# Patient Record
Sex: Male | Born: 1980 | Race: White | Hispanic: No | Marital: Married | Smoking: Never smoker
Health system: Southern US, Community
[De-identification: ages and names within clinical notes are randomized; demographics above are authoritative.]

---

## 2016-08-26 DIAGNOSIS — G43009 Migraine without aura, not intractable, without status migrainosus: Secondary | ICD-10-CM | POA: Insufficient documentation

## 2016-08-26 DIAGNOSIS — F411 Generalized anxiety disorder: Secondary | ICD-10-CM | POA: Insufficient documentation

## 2017-02-03 DIAGNOSIS — R41840 Attention and concentration deficit: Secondary | ICD-10-CM | POA: Insufficient documentation

## 2017-09-25 DIAGNOSIS — I10 Essential (primary) hypertension: Secondary | ICD-10-CM | POA: Insufficient documentation

## 2017-10-30 DIAGNOSIS — N529 Male erectile dysfunction, unspecified: Secondary | ICD-10-CM | POA: Insufficient documentation

## 2019-06-22 DIAGNOSIS — E781 Pure hyperglyceridemia: Secondary | ICD-10-CM | POA: Insufficient documentation

## 2020-01-04 ENCOUNTER — Ambulatory Visit: Payer: 59 | Attending: Internal Medicine

## 2020-01-04 DIAGNOSIS — Z23 Encounter for immunization: Secondary | ICD-10-CM

## 2020-01-04 NOTE — Progress Notes (Signed)
   Covid-19 Vaccination Clinic  Name:  Jacob Benton    MRN: [id number] DOB: 09/10/1980  01/04/2020  Mr. Jacob Benton was observed post Covid-19 immunization for 15 minutes without incident. He was provided with Vaccine Information Sheet and instruction to access the V-Safe system.   Mr. Jacob Benton was instructed to call 911 with any severe reactions post vaccine: Marland Kitchen Difficulty breathing  . Swelling of face and throat  . A fast heartbeat  . A bad rash all over body  . Dizziness and weakness   Immunizations Administered    Name Date Dose VIS Date Route   Pfizer COVID-19 Vaccine 01/04/2020  9:29 AM 0.3 mL 10/17/2018 Intramuscular   Manufacturer: [employer], [employer]   Lot: [id number]   NDC: [address]-3

## 2020-02-04 DIAGNOSIS — K580 Irritable bowel syndrome with diarrhea: Secondary | ICD-10-CM | POA: Insufficient documentation

## 2020-02-05 ENCOUNTER — Ambulatory Visit: Payer: 59 | Attending: Internal Medicine

## 2020-02-05 DIAGNOSIS — Z23 Encounter for immunization: Secondary | ICD-10-CM

## 2020-02-05 NOTE — Progress Notes (Signed)
   Covid-19 Vaccination Clinic  Name:  Jacob Benton    MRN: [id number] DOB: 30-Jul-1981  02/05/2020  Mr. Jacob Benton was observed post Covid-19 immunization for 15 minutes without incident. He was provided with Vaccine Information Sheet and instruction to access the V-Safe system.   Mr. Jacob Benton was instructed to call 911 with any severe reactions post vaccine: Marland Kitchen Difficulty breathing  . Swelling of face and throat  . A fast heartbeat  . A bad rash all over body  . Dizziness and weakness   Immunizations Administered    Name Date Dose VIS Date Route   Pfizer COVID-19 Vaccine 02/05/2020  8:41 AM 0.3 mL 10/17/2018 Intramuscular   Manufacturer: [employer], [employer]   Lot: [id number]   NDC: [address]-8

## 2020-05-06 ENCOUNTER — Other Ambulatory Visit: Payer: Self-pay

## 2020-05-06 ENCOUNTER — Ambulatory Visit (INDEPENDENT_AMBULATORY_CARE_PROVIDER_SITE_OTHER): Payer: 59 | Admitting: Dermatology

## 2020-05-06 DIAGNOSIS — L300 Nummular dermatitis: Secondary | ICD-10-CM | POA: Diagnosis not present

## 2020-05-06 DIAGNOSIS — L720 Epidermal cyst: Secondary | ICD-10-CM

## 2020-05-06 MED ORDER — CLOBETASOL PROPIONATE 0.05 % EX CREA: TOPICAL_CREAM | CUTANEOUS | 0 refills | Status: DC

## 2020-05-06 NOTE — Patient Instructions (Signed)
Topical steroids (such as triamcinolone, fluocinolone, fluocinonide, mometasone, clobetasol, halobetasol, betamethasone, hydrocortisone) can cause thinning and lightening of the skin if they are used for too long in the same area. Your physician has selected the right strength medicine for your problem and area affected on the body. Please use your medication only as directed by your physician to prevent side effects.   . 

## 2020-05-06 NOTE — Progress Notes (Signed)
   New Patient Visit  Subjective  Jacob Benton is a 39 y.o. male who presents for the following: Spot (left lower leg x 2 months, itchy) and spot (right face, present for years). No changes. No history of psoriasis, eczema, allergies, or asthma.  The following portions of the chart were reviewed this encounter and updated as appropriate:      Review of Systems:  No other skin or systemic complaints except as noted in HPI or Assessment and Plan.  Objective  Well appearing patient in no apparent distress; mood and affect are within normal limits.  A focused examination was performed including face, left leg. Relevant physical exam findings are noted in the Assessment and Plan.  Objective  Left Lateral Lower Leg: Small pink scaly plaque.  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 Avoid face, groin, axilla.  Avoid scratching/rubbing.  Recommend mild soap and moisturizing cream 1-2 times daily.   Topical steroids (such as triamcinolone, fluocinolone, fluocinonide, mometasone, clobetasol, halobetasol, betamethasone, hydrocortisone) can cause thinning and lightening of the skin if they are used for too long in the same area. Your physician has selected the right strength medicine for your problem and area affected on the body. Please use your medication only as directed by your physician to prevent side effects.    clobetasol cream (TEMOVATE) 0.05 % - Left Lateral Lower Leg  Epidermal inclusion cyst Right Medial Lower Cheek  vs Dermal Nevus with Cyst  Benign-appearing.  Observation.  Call clinic for new or changing moles.  Recommend daily use of broad spectrum spf 30+ sunscreen to sun-exposed areas.   Discussed removal if changes or grows.  Return in about 4 weeks (around  06/03/2020) for [facility] .   ICherlyn Labella, CMA, am acting as scribe for Willeen Niece, MD .  Documentation: I have reviewed the above documentation for accuracy and completeness, and I agree with the above.  Willeen Niece MD

## 2020-06-10 ENCOUNTER — Ambulatory Visit (INDEPENDENT_AMBULATORY_CARE_PROVIDER_SITE_OTHER): Payer: 59 | Admitting: Dermatology

## 2020-06-10 ENCOUNTER — Other Ambulatory Visit: Payer: Self-pay

## 2020-06-10 DIAGNOSIS — L299 Pruritus, unspecified: Secondary | ICD-10-CM | POA: Diagnosis not present

## 2020-06-10 DIAGNOSIS — L280 Lichen simplex chronicus: Secondary | ICD-10-CM | POA: Diagnosis not present

## 2020-06-10 NOTE — Progress Notes (Signed)
   Follow-Up Visit   Subjective  Jacob Benton is a 39 y.o. male who presents for the following: Follow-up.  Patient here today for 4 week follow up for nummular dermatitis vs psoriasis at left lower leg. He has been using clobetasol cream and advises the scale has improved and is still a little itchy.   The following portions of the chart were reviewed this encounter and updated as appropriate:      Review of Systems:  No other skin or systemic complaints except as noted in HPI or Assessment and Plan.  Objective  Well appearing patient in no apparent distress; mood and affect are within normal limits.  A focused examination was performed including left lower leg. Relevant physical exam findings are noted in the Assessment and Plan.  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  Avoid scratching/rubbing area  Intralesional injection - Left Lower Leg Location: left lower leg  Informed Consent: Discussed risks (infection, pain, bleeding, bruising, thinning of the skin, loss of skin pigment, lack of resolution, and recurrence of lesion) and benefits of the procedure, as well as the alternatives. Informed consent was obtained. Preparation: The area was prepared a standard fashion.  Anesthesia:none  Procedure Details: An intralesional injection was performed with Kenalog 10 mg/cc. 0.2 cc in total were injected.  Total number of injections: > 7  Plan: The patient was instructed on post-op care. Recommend OTC analgesia as needed for pain.   Pruritus  Return in about 4 weeks (around 07/08/2020) for [facility].  Anise Salvo, RMA, am acting as scribe for Willeen Niece, MD . Documentation: I have reviewed the above documentation for accuracy and completeness, and I agree with the  above.  Willeen Niece MD

## 2020-07-14 ENCOUNTER — Ambulatory Visit: Payer: 59 | Admitting: Dermatology

## 2020-09-25 ENCOUNTER — Other Ambulatory Visit: Payer: Self-pay | Admitting: Gastroenterology

## 2020-09-25 DIAGNOSIS — R7989 Other specified abnormal findings of blood chemistry: Secondary | ICD-10-CM

## 2020-10-01 ENCOUNTER — Other Ambulatory Visit: Payer: Self-pay

## 2020-10-01 ENCOUNTER — Ambulatory Visit
Admission: RE | Admit: 2020-10-01 | Discharge: 2020-10-01 | Disposition: A | Discharge status: Home / Self Care | Payer: 59 | Source: Ambulatory Visit | Attending: Gastroenterology | Admitting: Gastroenterology

## 2020-10-01 DIAGNOSIS — R7989 Other specified abnormal findings of blood chemistry: Secondary | ICD-10-CM

## 2021-06-08 ENCOUNTER — Ambulatory Visit: Payer: 59 | Admitting: Dermatology

## 2021-07-02 ENCOUNTER — Other Ambulatory Visit: Payer: Self-pay

## 2021-07-02 ENCOUNTER — Ambulatory Visit
Admission: EM | Admit: 2021-07-02 | Discharge: 2021-07-02 | Disposition: A | Discharge status: Home / Self Care | Payer: BC Managed Care – PPO | Attending: Emergency Medicine | Admitting: Emergency Medicine

## 2021-07-02 ENCOUNTER — Encounter: Payer: Self-pay | Admitting: Emergency Medicine

## 2021-07-02 DIAGNOSIS — B349 Viral infection, unspecified: Secondary | ICD-10-CM | POA: Diagnosis not present

## 2021-07-02 LAB — POCT RAPID STREP A (OFFICE): Rapid Strep A Screen: NEGATIVE

## 2021-07-02 LAB — POCT INFLUENZA A/B
Influenza A, POC: NEGATIVE
Influenza B, POC: NEGATIVE

## 2021-07-02 MED ORDER — BENZONATATE 100 MG PO CAPS: 100.0000 mg | ORAL_CAPSULE | Freq: Three times a day (TID) | ORAL | 0 refills | Status: DC | PRN

## 2021-07-02 NOTE — Discharge Instructions (Addendum)
The rapid flu test is negative.  The rapid strep test is negative.    Your COVID test is pending.  You should self quarantine until the test result is back.    Take Tylenol or ibuprofen as needed for fever or discomfort.  Rest and keep yourself hydrated.    Follow-up with your primary care provider if your symptoms are not improving.

## 2021-07-02 NOTE — ED Triage Notes (Signed)
Pt here with 2 days of dizziness, fever, headache, body aches and congestion. Did not have flu vaccine this year.

## 2021-07-02 NOTE — ED Provider Notes (Signed)
[facility]    CSN: [id number] Arrival date & time: 07/02/21  1631      History   Chief Complaint Chief Complaint  Patient presents with   Dizziness   Fever   Generalized Body Aches   Nasal Congestion     HPI Jacob Benton is a 40 y.o. male.  Patient presents with 2-day history of fever, chills, body aches, headache, dizziness, congestion, sore throat, cough.  He denies rash, shortness of breath, vomiting, diarrhea, or other symptoms.  Treatment attempted at home with OTC cold and flu medication.  Patient reports history of recurrent strep throat and would like to be tested.  The history is provided by the patient.   History reviewed. No pertinent past medical history.  There are no problems to display for this patient.   History reviewed. No pertinent surgical history.     Home Medications    Prior to Admission medications   Medication Sig Start Date End Date Taking? Authorizing Provider  benzonatate (TESSALON) 100 MG capsule Take 1 capsule (100 mg total) by mouth 3 (three) times daily as needed for cough. 07/02/21  Yes Mickie Bail, NP  atenolol-chlorthalidone (TENORETIC) 50-25 MG tablet Take 1 tablet by mouth daily. 02/04/20 02/03/21  [provider]  clobetasol cream (TEMOVATE) 0.05 % Spot treat to affected area on left leg twice daily until improved. Avoid face, groin, underarms. 05/06/20   Willeen Niece, MD  omeprazole (PRILOSEC) 40 MG capsule Take by mouth. 02/04/20 02/03/21  [provider]  sertraline (ZOLOFT) 50 MG tablet Take 50 mg by mouth daily. 03/31/20   [provider]    Family History History reviewed. No pertinent family history.  Social History Social History   Tobacco Use   Smoking status: Never   Smokeless tobacco: Never  Substance Use Topics   Alcohol use: Not Currently   Drug use: Never     Allergies   Patient has no known allergies.   Review of Systems Review of Systems  Constitutional:  Positive  for chills and fever.  HENT:  Positive for congestion and sore throat. Negative for ear pain.   Respiratory:  Positive for cough. Negative for shortness of breath.   Cardiovascular:  Negative for chest pain and palpitations.  Gastrointestinal:  Negative for diarrhea and vomiting.  Skin:  Negative for color change and rash.  Neurological:  Positive for dizziness and headaches. Negative for syncope.  All other systems reviewed and are negative.   Physical Exam Triage Vital Signs ED Triage Vitals  Enc Vitals Group     BP      Pulse      Resp      Temp      Temp src      SpO2      Weight      Height      Head Circumference      Peak Flow      Pain Score      Pain Loc      Pain Edu?      Excl. in GC?    No data found.  Updated Vital Signs BP 139/84   Pulse 92   Temp 99.3 F (37.4 C)   Resp 18   SpO2 98%   Visual Acuity Right Eye Distance:   Left Eye Distance:   Bilateral Distance:    Right Eye Near:   Left Eye Near:    Bilateral Near:     Physical Exam Vitals and  nursing note reviewed.  Constitutional:      General: He is not in acute distress.    Appearance: He is well-developed.  HENT:     Head: Normocephalic and atraumatic.     Right Ear: Tympanic membrane normal.     Left Ear: Tympanic membrane normal.     Nose: Congestion present.     Mouth/Throat:     Mouth: Mucous membranes are moist.     Pharynx: Oropharynx is clear.  Eyes:     Conjunctiva/sclera: Conjunctivae normal.  Cardiovascular:     Rate and Rhythm: Normal rate and regular rhythm.     Heart sounds: Normal heart sounds.  Pulmonary:     Effort: Pulmonary effort is normal. No respiratory distress.     Breath sounds: Normal breath sounds.  Abdominal:     Palpations: Abdomen is soft.     Tenderness: There is no abdominal tenderness.  Musculoskeletal:     Cervical back: Neck supple.  Skin:    General: Skin is warm and dry.  Neurological:     Mental Status: He is alert.  Psychiatric:         Mood and Affect: Mood normal.        Behavior: Behavior normal.     UC Treatments / Results  Labs (all labs ordered are listed, but only abnormal results are displayed) Labs Reviewed  NOVEL CORONAVIRUS, NAA  POCT INFLUENZA A/B  POCT RAPID STREP A (OFFICE)    EKG   Radiology No results found.  Procedures Procedures (including critical care time)  Medications Ordered in UC Medications - No data to display  Initial Impression / Assessment and Plan / UC Course  I have reviewed the triage vital signs and the nursing notes.  Pertinent labs & imaging results that were available during my care of the patient were reviewed by me and considered in my medical decision making (see chart for details).   Viral illness.  Rapid flu negative. Rapid strep negative.  COVID pending.  Instructed patient to self quarantine per CDC guidelines.  Discussed symptomatic treatment including Tylenol or ibuprofen, rest, hydration.  Instructed patient to follow up with PCP if symptoms are not improving.  Patient agrees to plan of care.    Final Clinical Impressions(s) / UC Diagnoses   Final diagnoses:  Viral illness     Discharge Instructions      The rapid flu test is negative.  The rapid strep test is negative.    Your COVID test is pending.  You should self quarantine until the test result is back.    Take Tylenol or ibuprofen as needed for fever or discomfort.  Rest and keep yourself hydrated.    Follow-up with your primary care provider if your symptoms are not improving.         ED Prescriptions     Medication Sig Dispense Auth. Provider   benzonatate (TESSALON) 100 MG capsule Take 1 capsule (100 mg total) by mouth 3 (three) times daily as needed for cough. 21 capsule Mickie Bail, NP      PDMP not reviewed this encounter.   Mickie Bail, NP 07/02/21 [phone]

## 2021-07-03 LAB — NOVEL CORONAVIRUS, NAA: SARS-CoV-2, NAA: NOT DETECTED

## 2021-07-03 LAB — SARS-COV-2, NAA 2 DAY TAT

## 2021-10-20 ENCOUNTER — Encounter: Payer: Self-pay | Admitting: Podiatry

## 2021-10-20 ENCOUNTER — Ambulatory Visit (INDEPENDENT_AMBULATORY_CARE_PROVIDER_SITE_OTHER): Payer: BC Managed Care – PPO | Admitting: Podiatry

## 2021-10-20 ENCOUNTER — Other Ambulatory Visit: Payer: Self-pay

## 2021-10-20 ENCOUNTER — Ambulatory Visit (INDEPENDENT_AMBULATORY_CARE_PROVIDER_SITE_OTHER): Payer: BC Managed Care – PPO

## 2021-10-20 DIAGNOSIS — M109 Gout, unspecified: Secondary | ICD-10-CM

## 2021-10-20 DIAGNOSIS — M10071 Idiopathic gout, right ankle and foot: Secondary | ICD-10-CM | POA: Diagnosis not present

## 2021-10-20 MED ORDER — METHYLPREDNISOLONE 4 MG PO TBPK: ORAL_TABLET | ORAL | 0 refills | Status: AC

## 2021-10-20 MED ORDER — COLCHICINE 0.6 MG PO TABS: 0.6000 mg | ORAL_TABLET | Freq: Every day | ORAL | 1 refills | Status: DC

## 2021-10-20 NOTE — Patient Instructions (Signed)
Gout ?Gout is a condition that causes painful swelling of the joints. Gout is a type of inflammation of the joints (arthritis). This condition is caused by having too much uric acid in the body. Uric acid is a chemical that forms when the body breaks down substances called purines. Purines are important for building body proteins. ?When the body has too much uric acid, sharp crystals can form and build up inside the joints. This causes pain and swelling. Gout attacks can happen quickly and may be very painful (acute gout). Over time, the attacks can affect more joints and become more frequent (chronic gout). Gout can also cause uric acid to build up under the skin and inside the kidneys. ?What are the causes? ?This condition is caused by too much uric acid in your blood. This can happen because: ?Your kidneys do not remove enough uric acid from your blood. This is the most common cause. ?Your body makes too much uric acid. This can happen with some cancers and cancer treatments. It can also occur if your body is breaking down too many red blood cells (hemolytic anemia). ?You eat too many foods that are high in purines. These foods include organ meats and some seafood. Alcohol, especially beer, is also high in purines. ?A gout attack may be triggered by trauma or stress. ?What increases the risk? ?You are more likely to develop this condition if you: ?Have a family history of gout. ?Are male and middle-aged. ?Are male and have gone through menopause. ?Are obese. ?Frequently drink alcohol, especially beer. ?Are dehydrated. ?Lose weight too quickly. ?Have an organ transplant. ?Have lead poisoning. ?Take certain medicines, including aspirin, cyclosporine, diuretics, levodopa, and niacin. ?Have kidney disease. ?Have a skin condition called psoriasis. ?What are the signs or symptoms? ?An attack of acute gout happens quickly. It usually occurs in just one joint. The most common place is the big toe. Attacks often start  at night. Other joints that may be affected include joints of the feet, ankle, knee, fingers, wrist, or elbow. Symptoms of this condition may include: ?Severe pain. ?Warmth. ?Swelling. ?Stiffness. ?Tenderness. The affected joint may be very painful to touch. ?Shiny, red, or purple skin. ?Chills and fever. ?Chronic gout may cause symptoms more frequently. More joints may be involved. You may also have white or yellow lumps (tophi) on your hands or feet or in other areas near your joints. ?How is this diagnosed? ?This condition is diagnosed based on your symptoms, medical history, and physical exam. You may have tests, such as: ?Blood tests to measure uric acid levels. ?Removal of joint fluid with a thin needle (aspiration) to look for uric acid crystals. ?X-rays to look for joint damage. ?How is this treated? ?Treatment for this condition has two phases: treating an acute attack and preventing future attacks. Acute gout treatment may include medicines to reduce pain and swelling, including: ?NSAIDs. ?Steroids. These are strong anti-inflammatory medicines that can be taken by mouth (orally) or injected into a joint. ?Colchicine. This medicine relieves pain and swelling when it is taken soon after an attack. It can be given by mouth or through an IV. ?Preventive treatment may include: ?Daily use of smaller doses of NSAIDs or colchicine. ?Use of a medicine that reduces uric acid levels in your blood. ?Changes to your diet. You may need to see a dietitian about what to eat and drink to prevent gout. ?Follow these instructions at home: ?During a gout attack ? ?If directed, put ice on the affected area: ?  Put ice in a plastic bag. ?Place a towel between your skin and the bag. ?Leave the ice on for 20 minutes, 2-3 times a day. ?Raise (elevate) the affected joint above the level of your heart as often as possible. ?Rest the joint as much as possible. If the affected joint is in your leg, you may be given crutches to  use. ?Follow instructions from your health care provider about eating or drinking restrictions. ?Avoiding future gout attacks ?Follow a low-purine diet as told by your dietitian or health care provider. Avoid foods and drinks that are high in purines, including liver, kidney, anchovies, asparagus, herring, mushrooms, mussels, and beer. ?Maintain a healthy weight or lose weight if you are overweight. If you want to lose weight, talk with your health care provider. It is important that you do not lose weight too quickly. ?Start or maintain an exercise program as told by your health care provider. ?Eating and drinking ?Drink enough fluids to keep your urine pale yellow. ?If you drink alcohol: ?Limit how much you use to: ?0-1 drink a day for women. ?0-2 drinks a day for men. ?Be aware of how much alcohol is in your drink. In the [place] one drink equals one 12 oz bottle of beer (355 mL) one 5 oz glass of wine (148 mL), or one 1? oz glass of hard liquor (44 mL). ?General instructions ?Take over-the-counter and prescription medicines only as told by your health care provider. ?Do not drive or use heavy machinery while taking prescription pain medicine. ?Return to your normal activities as told by your health care provider. Ask your health care provider what activities are safe for you. ?Keep all follow-up visits as told by your health care provider. This is important. ?Contact a health care provider if you have: ?Another gout attack. ?Continuing symptoms of a gout attack after 10 days of treatment. ?Side effects from your medicines. ?Chills or a fever. ?Burning pain when you urinate. ?Pain in your lower back or belly. ?Get help right away if you: ?Have severe or uncontrolled pain. ?Cannot urinate. ?Summary ?Gout is painful swelling of the joints caused by inflammation. ?The most common site of pain is the big toe, but it can affect other joints in the body. ?Medicines and dietary changes can help to prevent and treat gout  attacks. ?This information is not intended to replace advice given to you by your health care provider. Make sure you discuss any questions you have with your health care provider. ?Document Revised: 02/17/2018 Document Reviewed: 03/01/2018 ?Elsevier Patient Education ? 2022 [employer] ? ?

## 2021-10-20 NOTE — Progress Notes (Signed)
° °  HPI: 41 y.o. male presenting today as a new patient for evaluation of pain and increased redness and swelling to the right great toe joint.  Patient denies a history of injury.  It is been present for about 10 days now.  He states that he had an episode similar to this a few years ago and he was prescribed a Medrol Dosepak and wore a cam boot and it eventually resolved.  Acute idiopathic onset.  He presents for further treatment and evaluation  No past medical history on file.  No past surgical history on file.  No Known Allergies   Physical Exam: General: The patient is alert and oriented x3 in no acute distress.  Dermatology: Skin is warm, dry and supple bilateral lower extremities. Negative for open lesions or macerations.  Vascular: Palpable pedal pulses bilaterally. Capillary refill within normal limits.  Localized edema and erythema to the right first MTP joint  Neurological: Light touch and protective threshold grossly intact  Musculoskeletal Exam: Pain with light touch and palpation to the first MTP joint of the right foot with increased warmth and edema.  Radiographic Exam:  Normal osseous mineralization. Joint spaces preserved. No fracture/dislocation/boney destruction.    Assessment: 1.  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 Patient evaluated. X-Rays reviewed.  2.  Patient declined steroid injection today 3.  Prescription for Medrol Dosepak 4.  Prescription for colchicine 0.6 mg daily after completion of the Dosepak 5.  Continue wearing the cam boot that the patient already has at home daily until symptoms are resolved 6.  Discussed the etiology and pathology of gout today.  Recommend that the patient research foods to avoid that cause onset of gout 7.  Return to clinic as needed      Edrick Kins, DPM [facility]  Dr. Edrick Kins, DPM    [address], [place] [address]                 Office [phone]  Fax [phone]

## 2021-11-11 ENCOUNTER — Other Ambulatory Visit: Payer: Self-pay | Admitting: Podiatry

## 2021-11-11 NOTE — Telephone Encounter (Signed)
Please advise 

## 2021-12-05 ENCOUNTER — Other Ambulatory Visit: Payer: Self-pay | Admitting: Podiatry

## 2021-12-21 IMAGING — US US ABDOMEN COMPLETE
1 series · 14 of 25 positions shown · non-contrast
Comparison: None.

CLINICAL DATA: Elevated LFTs

EXAM:
ABDOMEN ULTRASOUND COMPLETE

[Series 1: us abdomen complete · 14 of 83 slices shown]
[im 1/83]
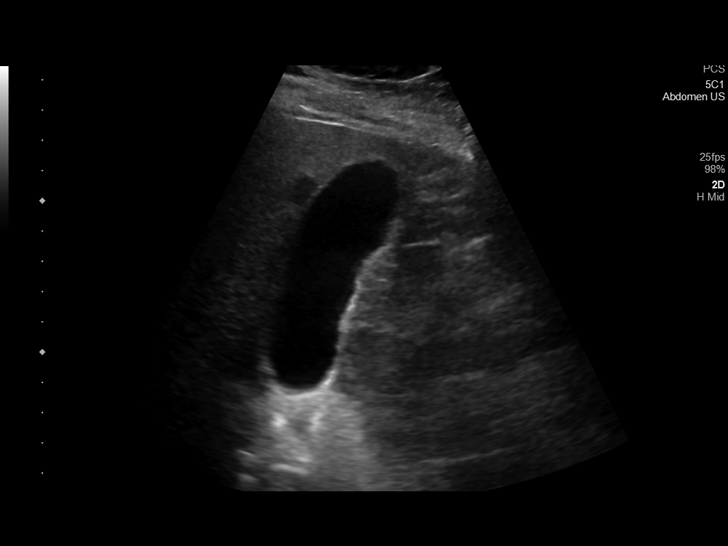
[im 7/83]
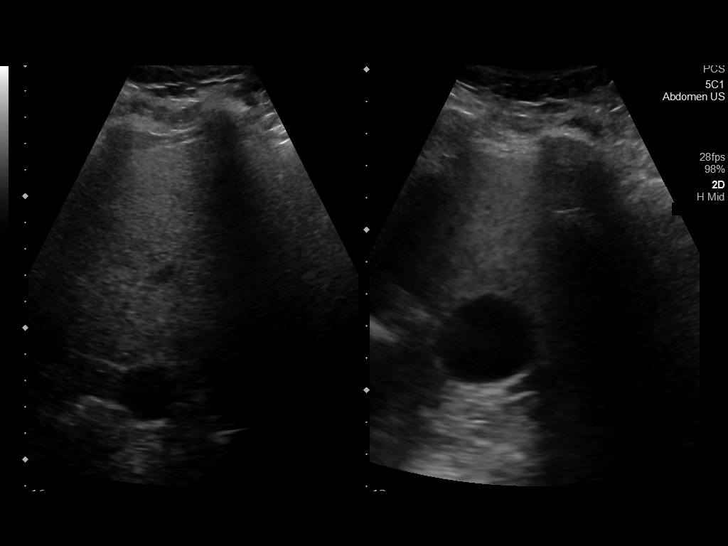
[im 14/83]
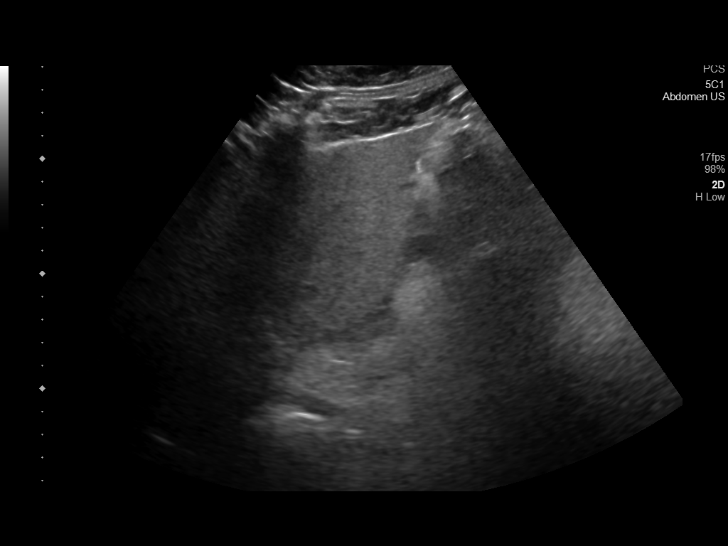
[im 21/83]
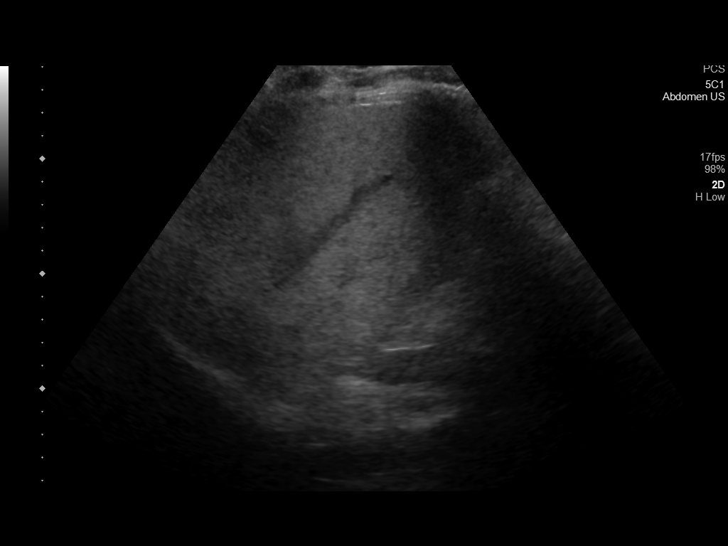
[im 28/83]
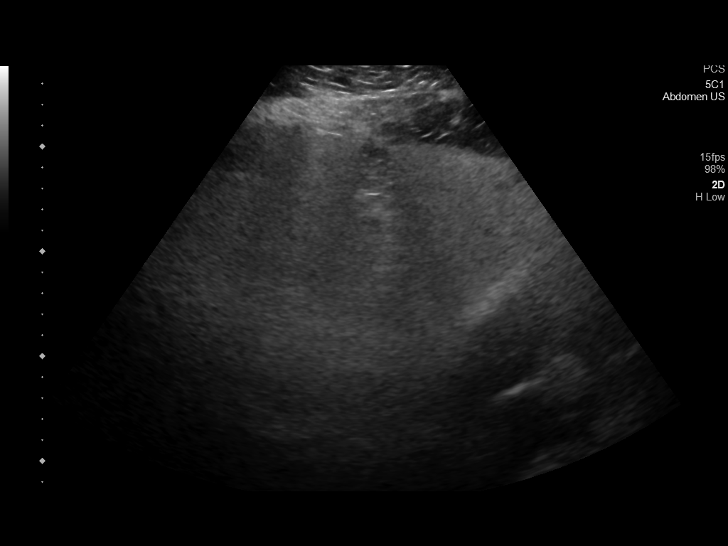
[im 31/83]
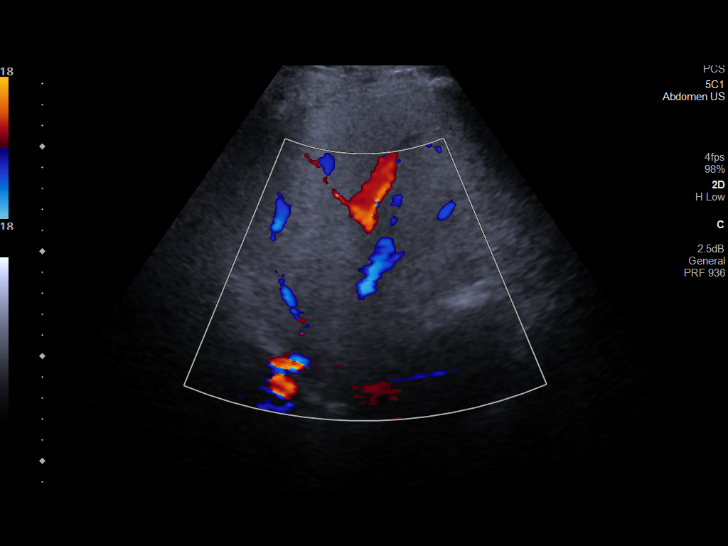
[im 38/83]
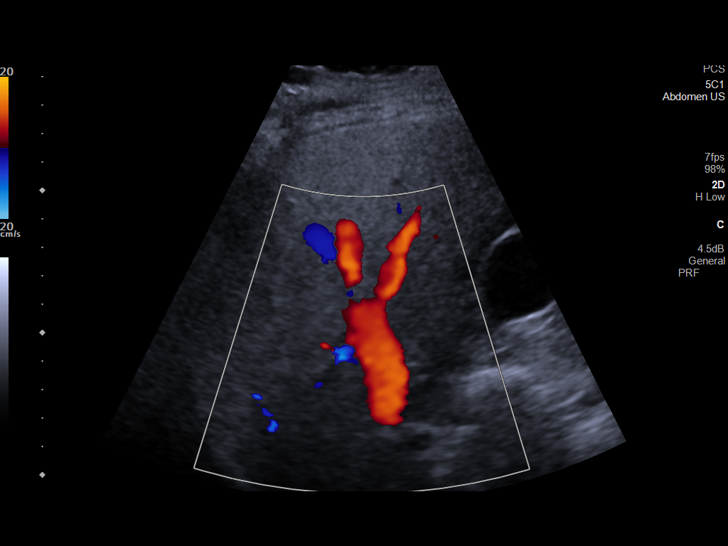
[im 45/83]
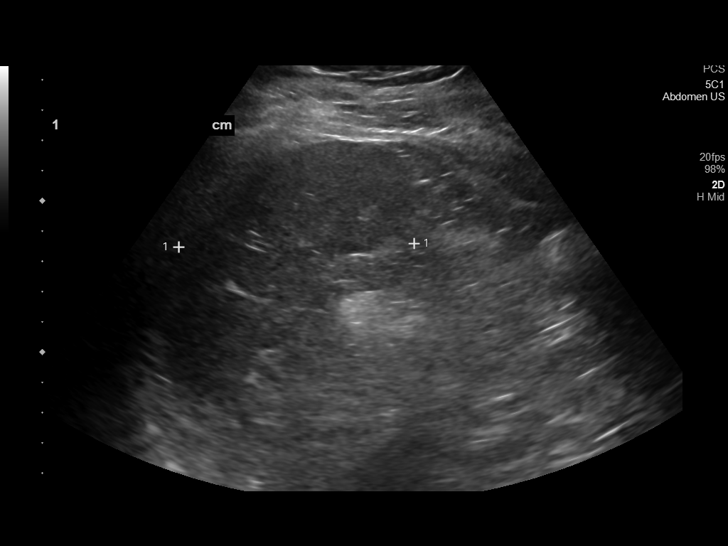
[im 52/83]
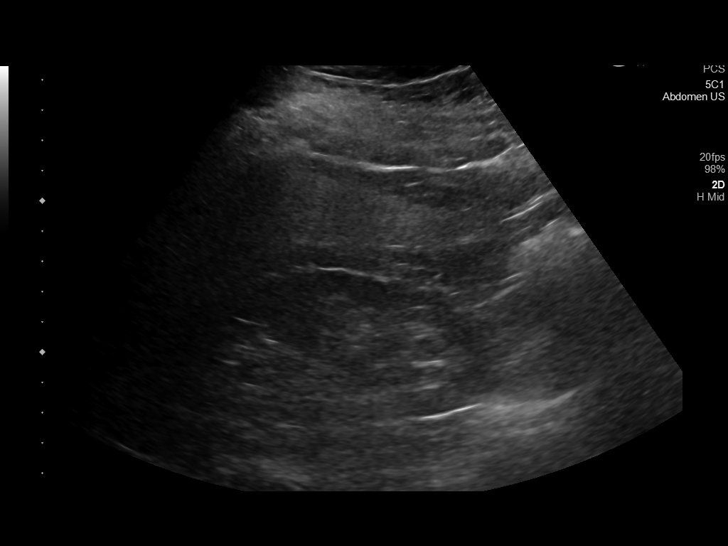
[im 55/83]
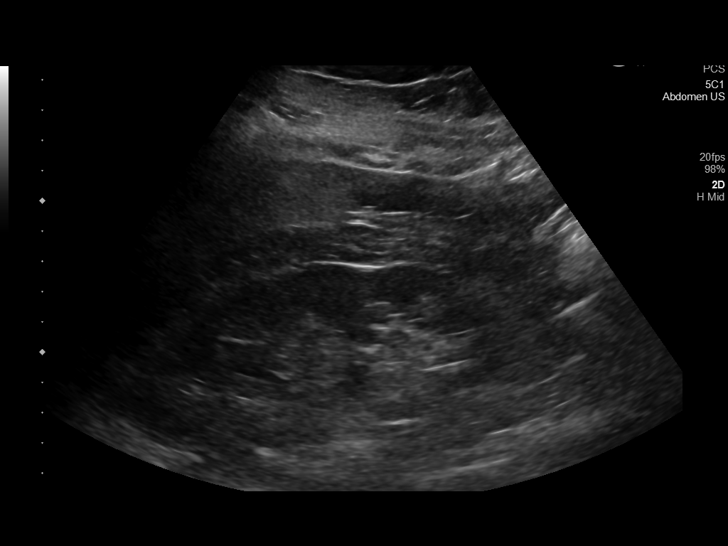
[im 62/83]
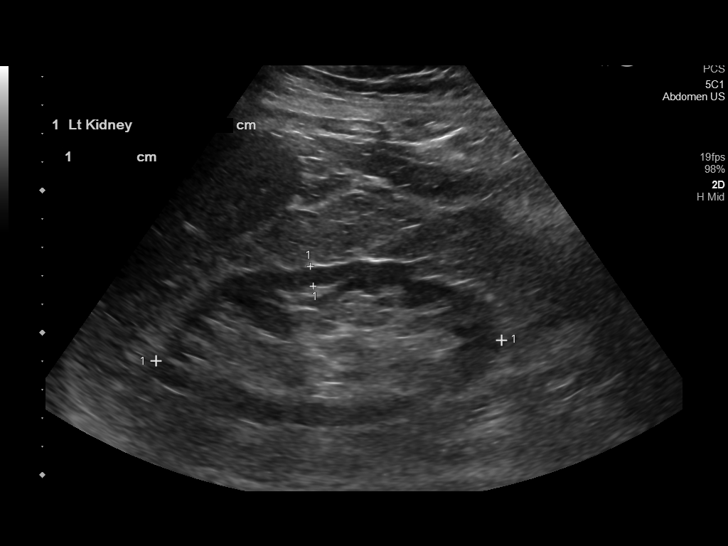
[im 69/83]
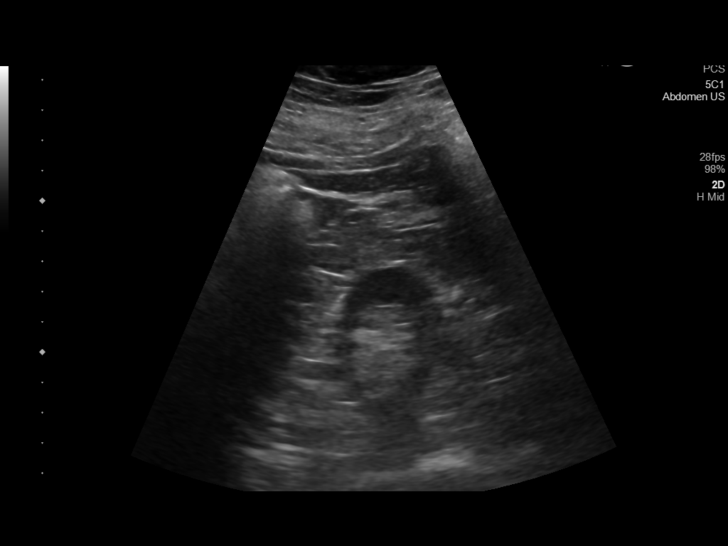
[im 76/83]
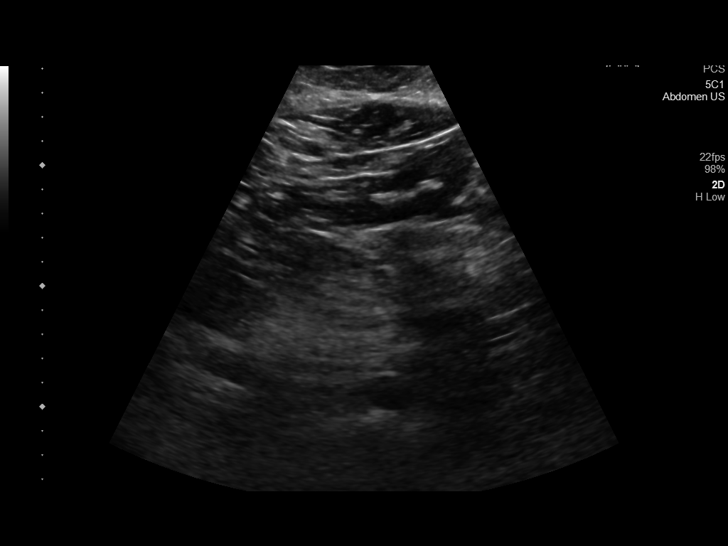
[im 83/83]
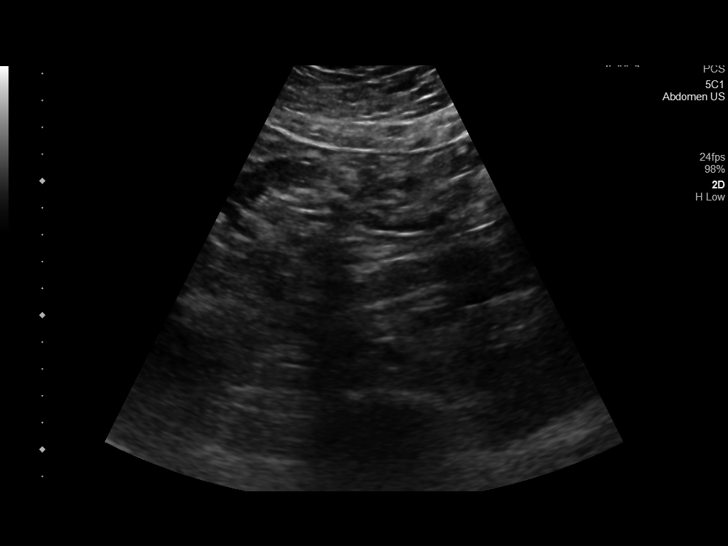

[14 of 25 positions shown; findings below may reference images not displayed]

FINDINGS: Gallbladder: No gallstones or wall thickening visualized. No
sonographic Murphy sign noted by sonographer.

Common bile duct: Diameter: 4 mm

Liver: Fatty infiltration is noted with focal fatty sparing at the
gallbladder fossa. Portal vein is patent on color Doppler imaging
with normal direction of blood flow towards the liver.

IVC: No abnormality visualized.

Pancreas: Not well seen.

Spleen: Size and appearance within normal limits.

Right Kidney: Length: 12 cm. Mild cortical thinning is noted. No
hydronephrosis is seen.

Left Kidney: Length: 12.2 cm. Mild cortical thinning is noted. No
hydronephrosis is noted.

Abdominal aorta: No aneurysm visualized.

Other findings: None.
IMPRESSION: Fatty liver.

Mild cortical thinning bilaterally in the kidneys. No other focal
abnormality is noted.

## 2022-01-14 ENCOUNTER — Other Ambulatory Visit: Payer: Self-pay | Admitting: Podiatry

## 2022-01-14 NOTE — Telephone Encounter (Signed)
Please advise 

## 2022-02-10 ENCOUNTER — Other Ambulatory Visit: Payer: Self-pay | Admitting: Podiatry

## 2022-02-10 NOTE — Telephone Encounter (Signed)
Please advise 

## 2022-02-24 ENCOUNTER — Other Ambulatory Visit: Payer: Self-pay | Admitting: Podiatry

## 2022-02-26 NOTE — Telephone Encounter (Signed)
Please advise
